# Patient Record
Sex: Male | Born: 1975
Health system: Southern US, Community
[De-identification: ages and names within clinical notes are randomized; demographics above are authoritative.]

## PROBLEM LIST (undated history)

## (undated) DIAGNOSIS — E785 Hyperlipidemia, unspecified: Secondary | ICD-10-CM

## (undated) HISTORY — DX: Hyperlipidemia, unspecified: E78.5

---

## 2000-01-22 ENCOUNTER — Emergency Department (HOSPITAL_COMMUNITY): Admission: EM | Admit: 2000-01-22 | Discharge: 2000-01-22 | Payer: Self-pay | Admitting: Emergency Medicine

## 2005-10-03 ENCOUNTER — Emergency Department (HOSPITAL_COMMUNITY): Admission: EM | Admit: 2005-10-03 | Discharge: 2005-10-03 | Payer: Self-pay | Admitting: Emergency Medicine

## 2008-01-28 ENCOUNTER — Encounter: Admission: RE | Admit: 2008-01-28 | Discharge: 2008-01-28 | Payer: Self-pay | Admitting: Family Medicine

## 2008-11-15 ENCOUNTER — Encounter: Admission: RE | Admit: 2008-11-15 | Discharge: 2008-11-15 | Payer: Self-pay | Admitting: Occupational Medicine

## 2009-07-23 ENCOUNTER — Emergency Department (HOSPITAL_COMMUNITY): Admission: EM | Admit: 2009-07-23 | Discharge: 2009-07-23 | Payer: Self-pay | Admitting: Emergency Medicine

## 2012-03-31 ENCOUNTER — Ambulatory Visit
Admission: RE | Admit: 2012-03-31 | Discharge: 2012-03-31 | Disposition: A | Discharge status: Home / Self Care | Payer: 59 | Source: Ambulatory Visit | Attending: Family Medicine | Admitting: Family Medicine

## 2012-03-31 ENCOUNTER — Other Ambulatory Visit: Payer: Self-pay | Admitting: Family Medicine

## 2012-03-31 DIAGNOSIS — M549 Dorsalgia, unspecified: Secondary | ICD-10-CM

## 2012-07-28 ENCOUNTER — Encounter (INDEPENDENT_AMBULATORY_CARE_PROVIDER_SITE_OTHER): Payer: 59

## 2012-07-28 ENCOUNTER — Other Ambulatory Visit: Payer: Self-pay | Admitting: Family Medicine

## 2012-07-28 DIAGNOSIS — E785 Hyperlipidemia, unspecified: Secondary | ICD-10-CM

## 2012-07-28 LAB — CBC WITH DIFFERENTIAL/PLATELET
Basophils Absolute: 0 10*3/uL (ref 0.0–0.1)
Basophils Relative: 0 % (ref 0–1)
Eosinophils Absolute: 0.2 10*3/uL (ref 0.0–0.7)
Eosinophils Relative: 3 % (ref 0–5)
Hemoglobin: 15.8 g/dL (ref 13.0–17.0)
MCH: 29.2 pg (ref 26.0–34.0)
Monocytes Absolute: 0.6 10*3/uL (ref 0.1–1.0)
RBC: 5.41 MIL/uL (ref 4.22–5.81)
RDW: 14.6 % (ref 11.5–15.5)

## 2012-07-28 LAB — LIPID PANEL
Cholesterol: 158 mg/dL (ref 0–200)
HDL: 43 mg/dL (ref >39)
Total CHOL/HDL Ratio: 3.7 Ratio
Triglycerides: 58 mg/dL (ref <150)
VLDL: 12 mg/dL (ref 0–40)

## 2012-07-28 LAB — BASIC METABOLIC PANEL
CO2: 26 mEq/L (ref 19–32)
Chloride: 106 mEq/L (ref 96–112)
Creat: 0.87 mg/dL (ref 0.50–1.35)

## 2012-07-28 LAB — HEPATIC FUNCTION PANEL
Albumin: 4.4 g/dL (ref 3.5–5.2)
Bilirubin, Direct: 0.2 mg/dL (ref 0.0–0.3)
Indirect Bilirubin: 0.4 mg/dL (ref 0.0–0.9)

## 2012-08-04 ENCOUNTER — Encounter: Payer: Self-pay | Admitting: Family Medicine

## 2012-08-18 ENCOUNTER — Telehealth: Payer: Self-pay | Admitting: Family Medicine

## 2012-08-18 MED ORDER — SCOPOLAMINE 1 MG/3DAYS TD PT72: 1.0000 | MEDICATED_PATCH | TRANSDERMAL | Status: DC

## 2012-08-18 NOTE — Telephone Encounter (Signed)
rx refilled.

## 2012-08-18 NOTE — Telephone Encounter (Signed)
Ok to call out scopolamine

## 2012-08-18 NOTE — Telephone Encounter (Signed)
Message copied by Ricard Dillon on Tue Aug 18, 2012  8:51 AM ------      Message from: Sherian Rein      Created: Mon Aug 17, 2012  9:58 AM      Contact: Arien Benincasa       Pt called back to receive lab results. His call back number is [phone]. ------

## 2013-03-20 ENCOUNTER — Other Ambulatory Visit: Payer: Self-pay | Admitting: Family Medicine

## 2013-05-11 ENCOUNTER — Telehealth: Payer: Self-pay | Admitting: Family Medicine

## 2013-05-11 NOTE — Telephone Encounter (Signed)
..  Patient aware per vm 

## 2013-05-11 NOTE — Telephone Encounter (Signed)
Sudafed for congestion, mucinex dm for cough, chloraseptic for st.  Expect improvement in 7-10 days

## 2013-05-11 NOTE — Telephone Encounter (Signed)
Pt is needing something for his cold he has a sore throat, and sinus drainage, has had 2 days  Call back number is [phone]

## 2013-05-25 ENCOUNTER — Encounter: Payer: Self-pay | Admitting: Family Medicine

## 2013-05-25 NOTE — Telephone Encounter (Signed)
Pt wife is calling regarding a question she had about her husband Call back number is [phone]

## 2013-05-28 NOTE — Telephone Encounter (Signed)
This encounter was created in error - please disregard.

## 2013-06-03 ENCOUNTER — Other Ambulatory Visit (HOSPITAL_COMMUNITY): Payer: Self-pay | Admitting: Internal Medicine

## 2013-06-03 ENCOUNTER — Other Ambulatory Visit: Payer: Self-pay | Admitting: Internal Medicine

## 2013-06-03 DIAGNOSIS — R52 Pain, unspecified: Secondary | ICD-10-CM

## 2013-06-03 DIAGNOSIS — N5082 Scrotal pain: Secondary | ICD-10-CM

## 2013-06-04 ENCOUNTER — Ambulatory Visit
Admission: RE | Admit: 2013-06-04 | Discharge: 2013-06-04 | Disposition: A | Discharge status: Home / Self Care | Payer: 59 | Source: Ambulatory Visit | Attending: Internal Medicine | Admitting: Internal Medicine

## 2013-06-04 DIAGNOSIS — R52 Pain, unspecified: Secondary | ICD-10-CM

## 2013-06-07 ENCOUNTER — Other Ambulatory Visit (HOSPITAL_COMMUNITY): Payer: 59

## 2014-06-20 IMAGING — US US ART/VEN ABD/PELV/SCROTUM DOPPLER LTD
1 series · 14 of 25 positions shown · non-contrast
Comparison: None.

CLINICAL DATA: Right testicular pain.  No trauma.  No swelling.

EXAM:
SCROTAL ULTRASOUND
DOPPLER ULTRASOUND OF THE TESTICLES
TECHNIQUE: Complete ultrasound examination of the testicles, epididymis, and
other scrotal structures was performed. Color and spectral Doppler
ultrasound were also utilized to evaluate blood flow to the
testicles.

[Series 1: us art/ven abd/pelv/scrotum doppler ltd · 0.09mm/px · 14 of 53 slices shown]
[im 1/53]
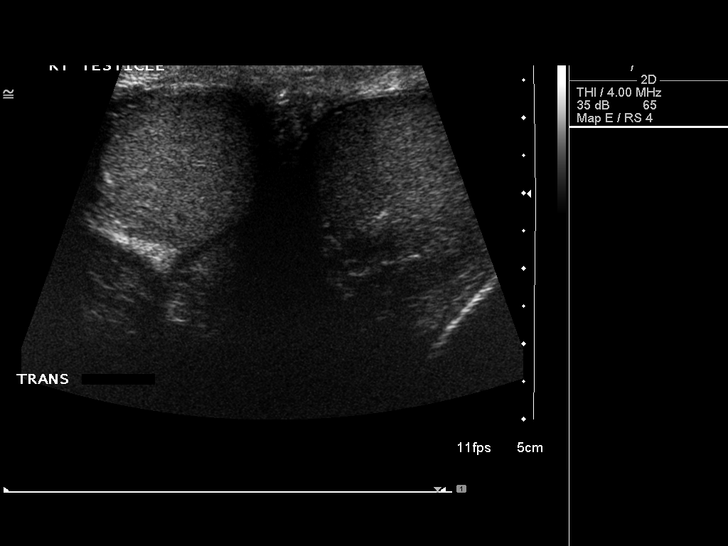
[im 5/53]
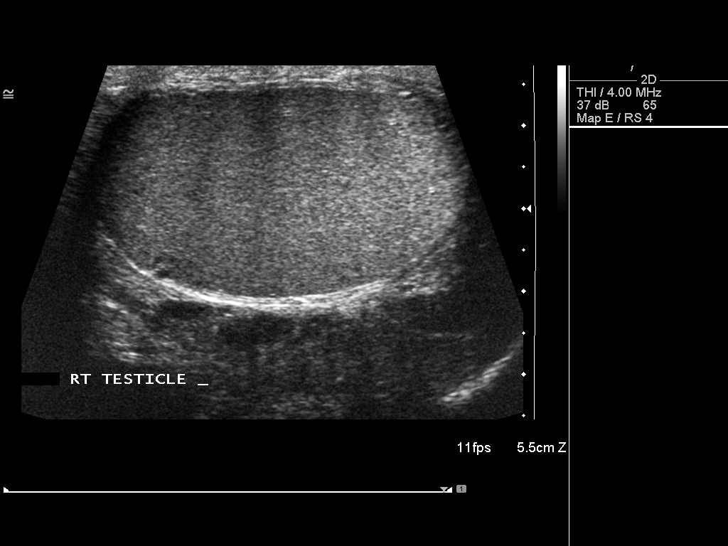
[im 9/53]
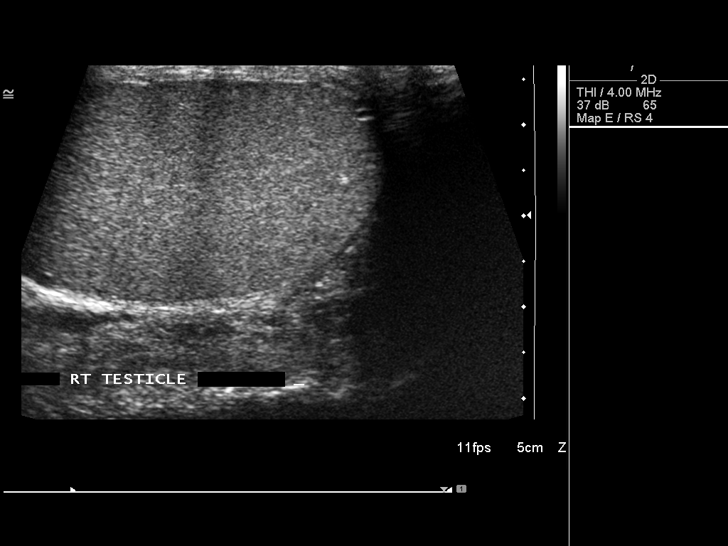
[im 14/53]
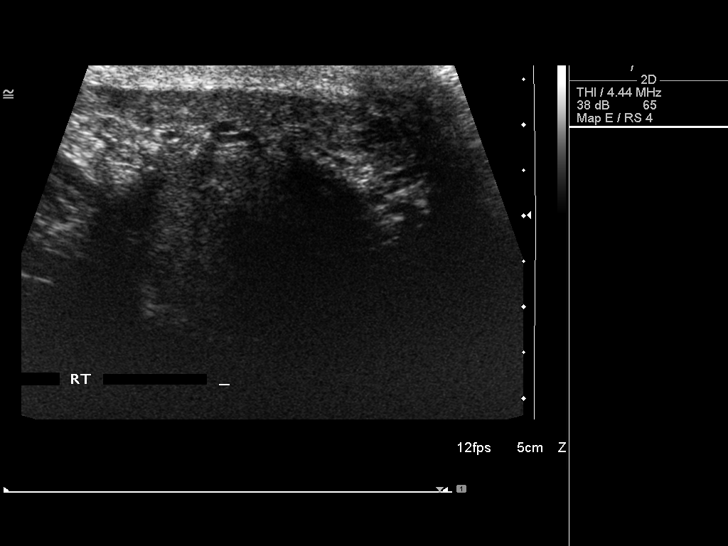
[im 18/53]
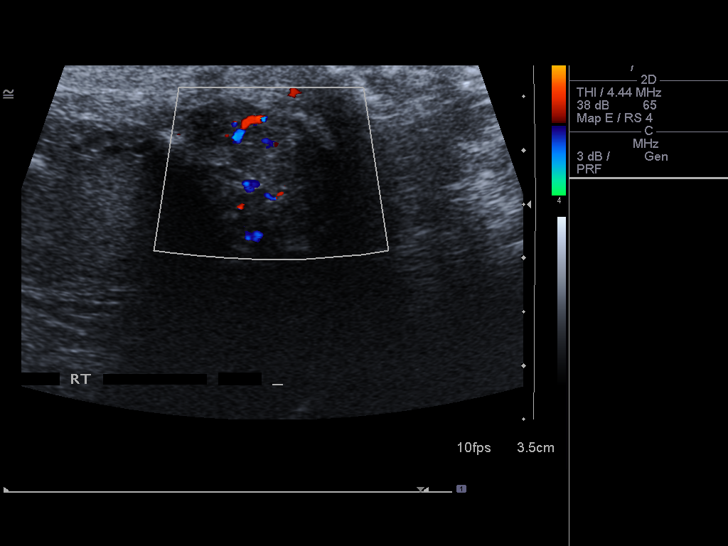
[im 20/53]
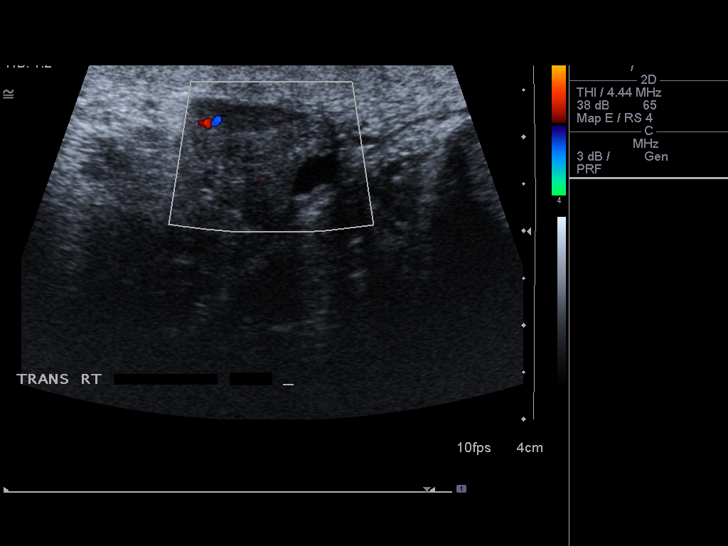
[im 24/53]
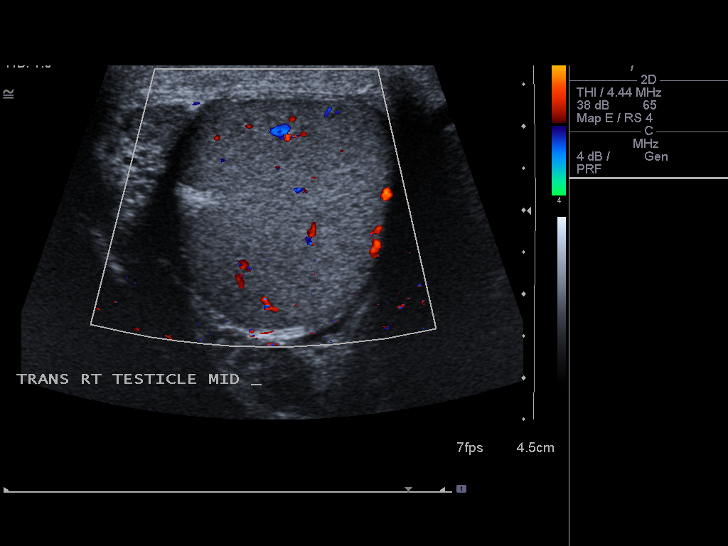
[im 29/53]
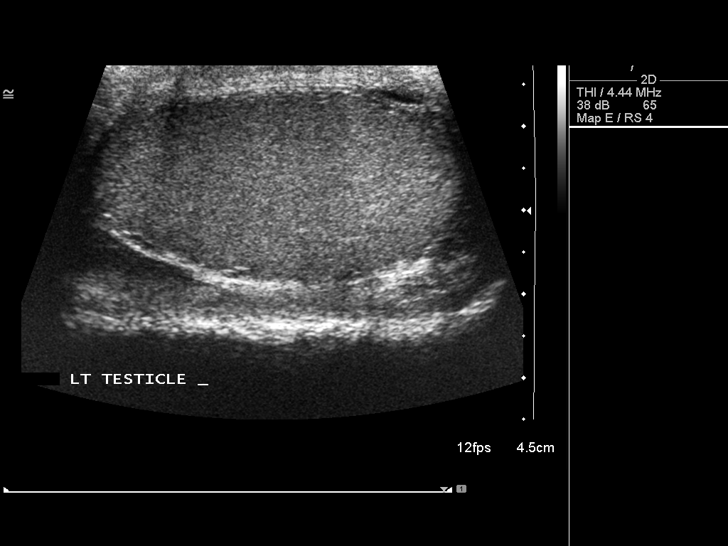
[im 33/53]
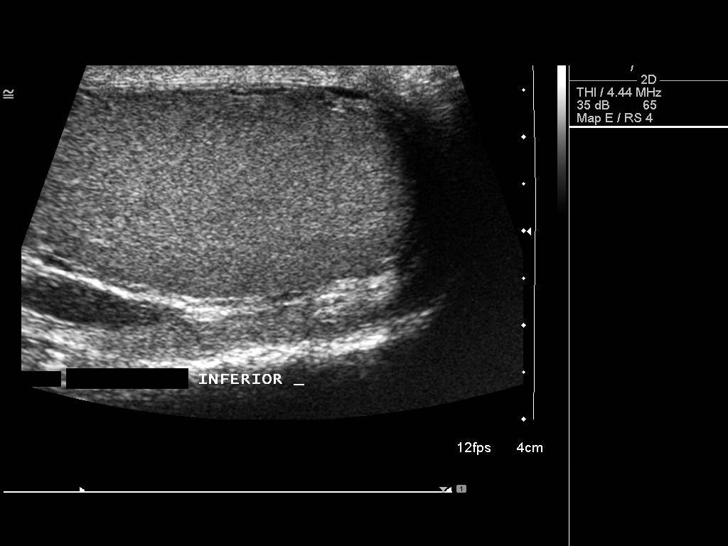
[im 35/53]
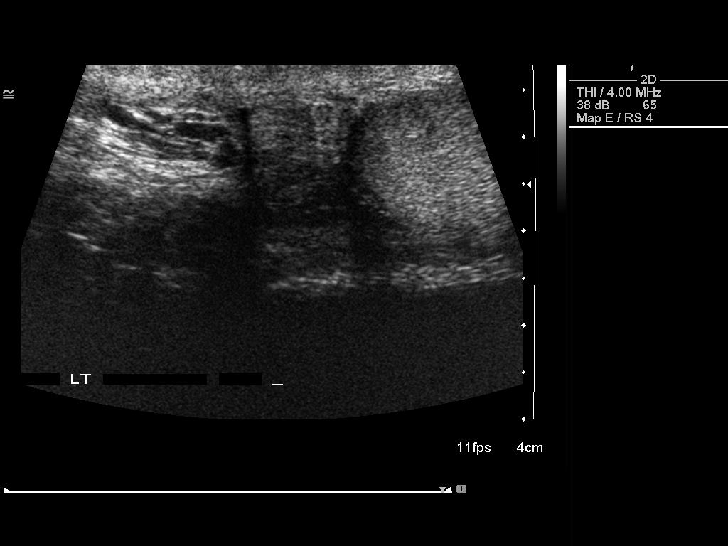
[im 40/53]
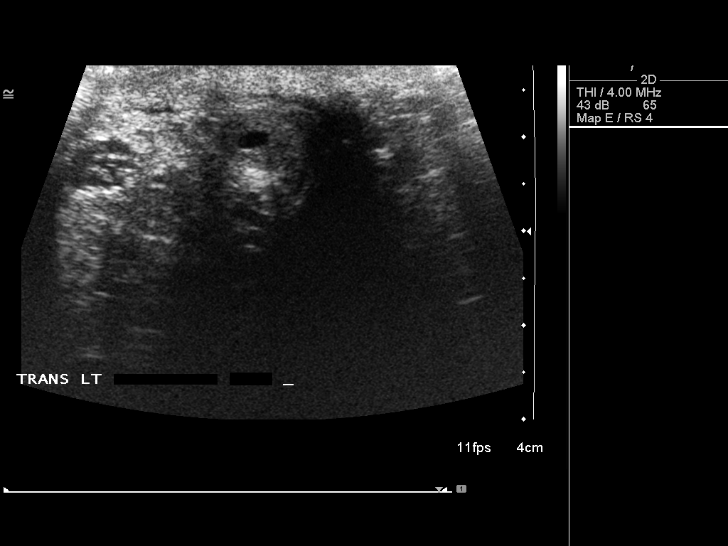
[im 44/53]
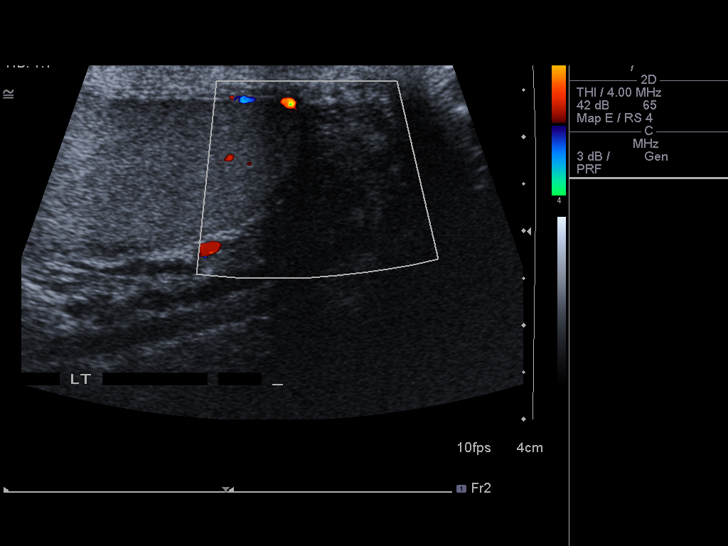
[im 48/53]
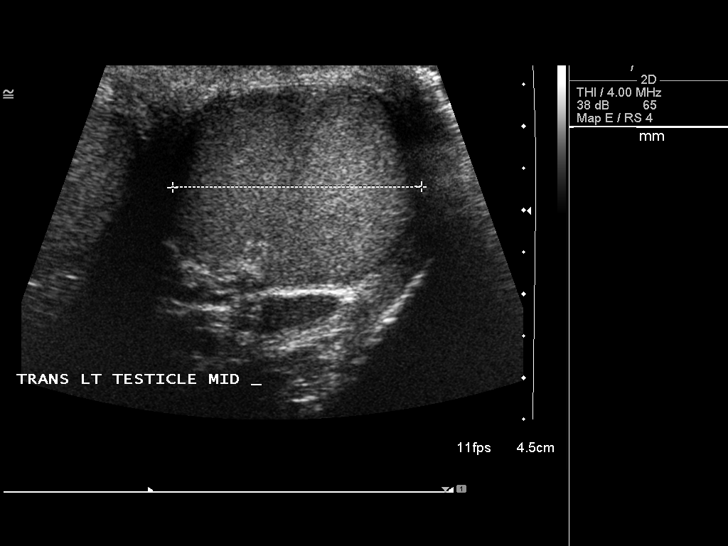
[im 53/53]
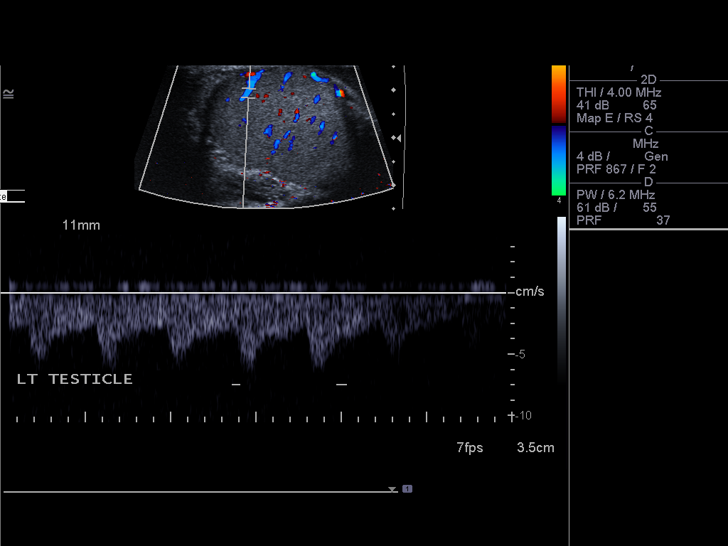

[14 of 25 positions shown; findings below may reference images not displayed]

FINDINGS: Right testicle

Measurements: 4.6 x 2.6 x 2.6 cm. No mass or microlithiasis
visualized.

Left testicle

Measurements: 4.5 x 2.4 x 3.0 cm. No mass or microlithiasis
visualized.

Right epididymis: Small 5 mm cyst in the epididymal head, otherwise
unremarkable.

Left epididymis: Small 3 mm cyst in the epididymal head, otherwise
unremarkable.

Hydrocele:  None visualized.

Varicocele:  None visualized.

Pulsed Doppler interrogation of both testes demonstrates normal low
resistance arterial and venous waveforms bilaterally.
IMPRESSION: No focal testicular abnormality. No evidence of torsion or
infection. Small bilateral epididymal head cysts.

## 2015-01-13 ENCOUNTER — Telehealth: Payer: Self-pay | Admitting: Family Medicine

## 2015-01-13 MED ORDER — SCOPOLAMINE 1 MG/3DAYS TD PT72: 1.0000 | MEDICATED_PATCH | TRANSDERMAL | Status: DC

## 2015-01-13 NOTE — Telephone Encounter (Signed)
Pt preferred the patches so Rx sent for scopolamine.

## 2015-01-13 NOTE — Telephone Encounter (Signed)
Ok with dramamine 50 mg po q 6 hrs prn OR Scopolamine patch q 72 hrs whichever he prefers.

## 2015-01-13 NOTE — Telephone Encounter (Signed)
Patient called states he is going to [place] next Friday and would like dramamine the highest dose called in to [facility] in [place]. He states he gets sea sick and is going fishing.

## 2015-01-13 NOTE — Telephone Encounter (Signed)
? -   Pt has not been seen here since Epic

## 2016-12-16 ENCOUNTER — Ambulatory Visit (INDEPENDENT_AMBULATORY_CARE_PROVIDER_SITE_OTHER): Payer: BLUE CROSS/BLUE SHIELD | Admitting: Family Medicine

## 2016-12-16 ENCOUNTER — Other Ambulatory Visit: Payer: Self-pay | Admitting: Family Medicine

## 2016-12-16 VITALS — BP 100/60 | Temp 99.5°F | Resp 14 | Ht 76.0 in | Wt 298.0 lb

## 2016-12-16 DIAGNOSIS — E860 Dehydration: Secondary | ICD-10-CM

## 2016-12-16 DIAGNOSIS — I951 Orthostatic hypotension: Secondary | ICD-10-CM | POA: Diagnosis not present

## 2016-12-16 DIAGNOSIS — I499 Cardiac arrhythmia, unspecified: Secondary | ICD-10-CM

## 2016-12-16 DIAGNOSIS — R509 Fever, unspecified: Secondary | ICD-10-CM

## 2016-12-16 LAB — CBC
HEMATOCRIT: 48.9 % (ref 38.5–50.0)
HEMOGLOBIN: 16.7 g/dL (ref 13.0–17.0)
MCH: 31.5 pg (ref 27.0–33.0)
MCHC: 34.2 g/dL (ref 32.0–36.0)
MCV: 92.3 fL (ref 80.0–100.0)
PLATELETS: 221 10*3/uL (ref 140–400)
RBC: 5.3 MIL/uL (ref 4.20–5.80)
RDW: 14.7 % (ref 11.0–15.0)
WBC: 10.7 10*3/uL (ref 3.8–10.8)

## 2016-12-16 LAB — GLUCOSE, FINGERSTICK (STAT): Glucose, fingerstick: 124 mg/dL — ABNORMAL HIGH (ref 65–99)

## 2016-12-16 LAB — COMPLETE METABOLIC PANEL WITH GFR
ALBUMIN: 4.3 g/dL (ref 3.6–5.1)
ALK PHOS: 52 U/L (ref 40–115)
ALT: 26 U/L (ref 9–46)
AST: 17 U/L (ref 10–40)
BILIRUBIN TOTAL: 1.1 mg/dL (ref 0.2–1.2)
BUN: 19 mg/dL (ref 7–25)
CO2: 22 mmol/L (ref 20–32)
CREATININE: 0.93 mg/dL (ref 0.60–1.35)
Calcium: 9.2 mg/dL (ref 8.6–10.3)
Chloride: 104 mmol/L (ref 98–110)
GFR, Est Non African American: >89 mL/min (ref >=60)
Glucose, Bld: 113 mg/dL — ABNORMAL HIGH (ref 70–99)
Potassium: 4.5 mmol/L (ref 3.5–5.3)
Sodium: 140 mmol/L (ref 135–146)
TOTAL PROTEIN: 6.5 g/dL (ref 6.1–8.1)

## 2016-12-16 MED ORDER — DOXYCYCLINE HYCLATE 100 MG PO TABS: 100.0000 mg | ORAL_TABLET | Freq: Two times a day (BID) | ORAL | 0 refills | Status: DC

## 2016-12-16 NOTE — Progress Notes (Signed)
Subjective:    Patient ID: Sean NewtonChristopher M Lafrance, male    DOB: [birth year], 41 y.o.   MRN: [id number]  HPI Saturday evening, patient admits to consuming large amounts of alcohol. Yesterday, he sat in the heat watching softball and did not drink sufficient fluid.  Today the patient was working in the heat. 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 At lunchtime, the patient suddenly became lightheaded. He felt very dizzy. He felt weak all over. He felt like his heart was racing. He began to shake uncontrollably. He became extremely anxious. His wife rushed him to the doctor's office. Upon arrival, the patient was tachycardic at 105 bpm with a relatively low blood pressure given his past history. He also had a low-grade fever to 99.5. Patient denies any symptoms of illness. He denies any ear pain, cough, sore throat, sinus pressure, sinus pain, shortness of breath, chest pain, nausea vomiting or diarrhea. He does admit to having a low-grade headache today. He also reports some mild neck stiffness in addition to diffuse body aches that have all developed today. 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 No past surgical history on file. No current outpatient prescriptions on file prior to visit.   No current facility-administered medications on file prior to visit.    No Known Allergies Social History   Social History  . Marital status: Married    Spouse name: N/A  . Number of children: N/A  . Years of education: N/A   Occupational History  . Not on file.   Social History Main Topics  . Smoking status: Not on file  . Smokeless tobacco: Not on file  . Alcohol use Not on file  . Drug use: Unknown  . Sexual activity: Not on file   Other Topics Concern  . Not on file   Social History Narrative  . No narrative on file      Review of Systems  Constitutional: Positive for chills, fatigue  and fever.  HENT: Negative for congestion, dental problem, ear discharge, ear pain, facial swelling, rhinorrhea, sinus pain, sinus pressure, sneezing and sore throat.   Eyes: Negative.   Respiratory: Negative for cough, choking, chest tightness, shortness of breath, wheezing and stridor.   Cardiovascular: Negative for chest pain, palpitations and leg swelling.  Gastrointestinal: Positive for anal bleeding. Negative for abdominal distention, abdominal pain, blood in stool, constipation, diarrhea and nausea.  Genitourinary: Positive for decreased urine volume. Negative for dysuria, flank pain, frequency, penile pain, penile swelling, testicular pain and urgency.  Musculoskeletal: Positive for arthralgias, myalgias and neck pain.  Skin: Negative for pallor, rash and wound.  Neurological: Positive for dizziness, tremors, light-headedness and headaches. Negative for seizures, syncope, facial asymmetry, speech difficulty, weakness and numbness.       Objective:   Physical Exam  Constitutional: He is oriented to person, place, and time. He appears well-developed and well-nourished. No distress.  HENT:  Head: Normocephalic and atraumatic.  Right Ear: Tympanic membrane, external ear and ear canal normal.  Left Ear: Tympanic membrane, external ear and ear canal normal. No decreased hearing is noted.  Nose: Nose normal.  Mouth/Throat: Mucous membranes are dry. No oropharyngeal exudate.  Cardiovascular: Regular rhythm.  Tachycardia present.   Pulmonary/Chest: Effort normal and breath sounds normal. No respiratory distress. He has no wheezes. He has no rales.  Abdominal: Soft. Bowel sounds are normal. He exhibits no  distension and no mass. There is no tenderness. There is no rebound and no guarding.  Musculoskeletal: He exhibits no edema or tenderness.  Neurological: He is alert and oriented to person, place, and time. He has normal reflexes. He displays normal reflexes. No cranial nerve deficit. He  exhibits normal muscle tone. Coordination normal.  Skin: No rash noted. He is not diaphoretic.  Psychiatric: His mood appears anxious.  Vitals reviewed.         Assessment & Plan:  Irregular heart beat - Plan: EKG 12-Lead, CBC with Differential/Platelet  Dehydration  Orthostatic hypotension  Low grade fever  I believe the patient was dehydrated secondary to alcohol intake and lack of adequate fluid. I then believe he developed some heat exhaustion secondary to working in the heat. I believe his blood pressure began to drop and he became lightheaded. I believe this triggered a panic attack causing his tachycardia. Other possibilities would be the low-grade fever 99.5 indicating an underlying infection causing his blood pressure to drop triggering the tachycardia. The shaking would then be due for the low-grade fever. However his exam and his history revealed no obvious sign of infection other than mild neck pain, mild headache, muscle aches, and a low-grade fever. Viral illness is unlikely this time a year. Tick fever is a possibility such as [facility] spotted fever. Patient received 2 L of IV fluid in the office. He received 1 L as a bolus and a second liter gradually over the next hour and a half. He was monitored in the office for more than an hour and a half and was completely back to his baseline by the time of discharge. CBC showed a white count of 10.7, normal hemoglobin with no evidence of leukocytosis or anemia. EKG reveals sinus tachycardia with normal intervals and a normal axis and no evidence of ischemia or infarction. I believe the majority of this was heat exhaustion and dehydration and an underlying panic attack. However I cannot exclude tick fever. I will check a CBC, CMP, Lyme titer, [place] spotted fever titers but in the meantime I'll start the patient on doxycycline 100 mg by mouth twice a day for 10 days

## 2016-12-17 LAB — ROCKY MTN SPOTTED FVR ABS PNL(IGG+IGM)
RMSF IGM: NOT DETECTED
RMSF IgG: NOT DETECTED

## 2016-12-17 LAB — LYME ABY, WSTRN BLT IGG & IGM W/BANDS
B burgdorferi IgG Abs (IB): NEGATIVE
B burgdorferi IgM Abs (IB): NEGATIVE
LYME DISEASE 23 KD IGG: NONREACTIVE
LYME DISEASE 23 KD IGM: REACTIVE — AB
LYME DISEASE 39 KD IGM: NONREACTIVE
LYME DISEASE 41 KD IGG: REACTIVE — AB
LYME DISEASE 41 KD IGM: NONREACTIVE
LYME DISEASE 45 KD IGG: NONREACTIVE
Lyme Disease 18 kD IgG: NONREACTIVE
Lyme Disease 28 kD IgG: NONREACTIVE
Lyme Disease 30 kD IgG: NONREACTIVE
Lyme Disease 39 kD IgG: NONREACTIVE
Lyme Disease 58 kD IgG: NONREACTIVE
Lyme Disease 66 kD IgG: NONREACTIVE
Lyme Disease 93 kD IgG: NONREACTIVE

## 2017-04-08 ENCOUNTER — Encounter: Payer: Self-pay | Admitting: Family Medicine

## 2017-04-08 ENCOUNTER — Ambulatory Visit: Payer: BLUE CROSS/BLUE SHIELD | Admitting: Family Medicine

## 2017-04-17 ENCOUNTER — Ambulatory Visit: Payer: BLUE CROSS/BLUE SHIELD | Admitting: Family Medicine

## 2017-04-17 ENCOUNTER — Encounter: Payer: Self-pay | Admitting: Family Medicine

## 2017-04-17 VITALS — BP 142/88 | HR 108 | Temp 98.2°F | Resp 20 | Ht 76.0 in | Wt 314.0 lb

## 2017-04-17 DIAGNOSIS — G8929 Other chronic pain: Secondary | ICD-10-CM | POA: Diagnosis not present

## 2017-04-17 DIAGNOSIS — M25561 Pain in right knee: Secondary | ICD-10-CM | POA: Diagnosis not present

## 2017-04-17 DIAGNOSIS — M549 Dorsalgia, unspecified: Secondary | ICD-10-CM | POA: Diagnosis not present

## 2017-04-17 MED ORDER — MELOXICAM 15 MG PO TABS: 15.0000 mg | ORAL_TABLET | Freq: Every day | ORAL | 0 refills | Status: DC

## 2017-04-18 NOTE — Progress Notes (Signed)
Subjective:    Patient ID: Sean Oconnor, male    DOB: September 07, 1975, 41 y.o.   MRN: [id number]  HPI  Patient presents with 2 months of right knee pain.  He denies any specific injury.  Pain primarily occurs lateral to the patella and radiate into the knee joint.  He will be completely pain-free.  Then suddenly while standing, he will developed sharp severe pain.  He has to sit down and bend his knee in a certain way and then the pain will be relieved almost as if there is a "catch" in the knee.  He also reports that the knee will buckle and feels that there is laxity in the knee joint.  On examination today, the patient has no laxity to varus or valgus stress.  He has a negative posterior drawer sign.  He has a positive anterior drawer sign and that the pain is immediately reproduced however there is no laxity in the anterior drawer sign.  He has a negative Apley grind and no evidence of a meniscal tear.  He also presents with somewhat chronic low back pain for several months.  Pain is located approximately the level of L1.  In that area there is a small sebaceous cyst overlying the point of maximum tenderness.  He is concerned that it is some type of "tumor" growing into the spine.  Cyst is approximately 1 cm diameter and freely mobile.  I will try to reassure the patient that the sebaceous cyst has nothing to do with his back pain.  He states that the pain is severe when he tries to get out of bed in the morning.  It causes sharp sudden pain but as soon as he stands up and moves around the pain will go away.  He denies any saddle anesthesia.  He denies any radiculopathy into his legs.  He denies any numbness or tingling in his legs. No past medical history on file.  No Known Allergies Social History   Socioeconomic History  . Marital status: Married    Spouse name: Not on file  . Number of children: Not on file  . Years of education: Not on file  . Highest education level: Not on file    Social Needs  . Financial resource strain: Not on file  . Food insecurity - worry: Not on file  . Food insecurity - inability: Not on file  . Transportation needs - medical: Not on file  . Transportation needs - non-medical: Not on file  Occupational History  . Not on file  Tobacco Use  . Smoking status: Former Games developermoker  . Smokeless tobacco: Never Used  Substance and Sexual Activity  . Alcohol use: Yes  . Drug use: No  . Sexual activity: Not on file  Other Topics Concern  . Not on file  Social History Narrative  . Not on file     Review of Systems  All other systems reviewed and are negative.      Objective:   Physical Exam  Constitutional: He appears well-developed and well-nourished.  Cardiovascular: Normal rate, regular rhythm and normal heart sounds.  Pulmonary/Chest: Effort normal and breath sounds normal. No respiratory distress. He has no wheezes. He has no rales.  Musculoskeletal:       Right knee: He exhibits normal range of motion, no swelling, no effusion, no ecchymosis, no deformity, no erythema, no LCL laxity, normal patellar mobility, normal meniscus and no MCL laxity. No medial joint line, no lateral joint  line, no MCL and no LCL tenderness noted.       Lumbar back: He exhibits decreased range of motion and pain. He exhibits no tenderness and no bony tenderness.       Back:  Vitals reviewed.         Assessment & Plan:  Mid back pain - Plan: DG Lumbar Spine Complete  Chronic pain of right knee - Plan: DG Knee Complete 4 Views Right  I suspect that the cause of his back pain is muscle pain coupled with possible early degenerative disc disease given his obesity.  We will begin by obtaining a lumbar spine x-ray to evaluate further.  I have recommended meloxicam 15 mg daily.  I am suspicious that the patient may have a sprain in his ACL or possibly a cartilage defect in the knee that would benefit from scopic surgery.  I will begin by obtaining an x-ray to  evaluate further.  Begin meloxicam 15 mg daily.  Patient states the pain is gradually improving.  Therefore I have recommended clinical monitoring for 1 month.  If pain persists, consider a cortisone injection versus an MRI of the knee to evaluate further to determine if the patient would benefit from arthroscopic surgery.

## 2018-10-22 ENCOUNTER — Encounter: Payer: BLUE CROSS/BLUE SHIELD | Admitting: Family Medicine

## 2018-10-22 ENCOUNTER — Other Ambulatory Visit: Payer: BC Managed Care – PPO

## 2018-10-22 ENCOUNTER — Other Ambulatory Visit: Payer: Self-pay

## 2018-10-22 DIAGNOSIS — Z0000 Encounter for general adult medical examination without abnormal findings: Secondary | ICD-10-CM

## 2018-10-22 DIAGNOSIS — Z13220 Encounter for screening for lipoid disorders: Secondary | ICD-10-CM | POA: Insufficient documentation

## 2018-10-23 ENCOUNTER — Encounter: Payer: Self-pay | Admitting: Family Medicine

## 2018-10-23 ENCOUNTER — Ambulatory Visit (INDEPENDENT_AMBULATORY_CARE_PROVIDER_SITE_OTHER): Payer: BC Managed Care – PPO | Admitting: Family Medicine

## 2018-10-23 VITALS — BP 142/88 | HR 100 | Temp 98.0°F | Resp 17 | Ht 76.0 in | Wt 330.4 lb

## 2018-10-23 DIAGNOSIS — Z0000 Encounter for general adult medical examination without abnormal findings: Secondary | ICD-10-CM

## 2018-10-23 DIAGNOSIS — Z6841 Body Mass Index (BMI) 40.0 and over, adult: Secondary | ICD-10-CM

## 2018-10-23 DIAGNOSIS — R030 Elevated blood-pressure reading, without diagnosis of hypertension: Secondary | ICD-10-CM

## 2018-10-23 DIAGNOSIS — E785 Hyperlipidemia, unspecified: Secondary | ICD-10-CM

## 2018-10-23 DIAGNOSIS — Z0001 Encounter for general adult medical examination with abnormal findings: Secondary | ICD-10-CM | POA: Diagnosis not present

## 2018-10-23 DIAGNOSIS — Z8249 Family history of ischemic heart disease and other diseases of the circulatory system: Secondary | ICD-10-CM | POA: Diagnosis not present

## 2018-10-23 DIAGNOSIS — Z83438 Family history of other disorder of lipoprotein metabolism and other lipidemia: Secondary | ICD-10-CM

## 2018-10-23 DIAGNOSIS — Z23 Encounter for immunization: Secondary | ICD-10-CM | POA: Diagnosis not present

## 2018-10-23 LAB — CBC WITH DIFFERENTIAL/PLATELET
Absolute Monocytes: 600 cells/uL (ref 200–950)
Basophils Absolute: 28 cells/uL (ref 0–200)
Basophils Relative: 0.4 %
Eosinophils Absolute: 262 cells/uL (ref 15–500)
Eosinophils Relative: 3.8 %
HCT: 50.4 % — ABNORMAL HIGH (ref 38.5–50.0)
Hemoglobin: 16.5 g/dL (ref 13.2–17.1)
Lymphs Abs: 1994 cells/uL (ref 850–3900)
MCH: 29 pg (ref 27.0–33.0)
MCHC: 32.7 g/dL (ref 32.0–36.0)
MCV: 88.6 fL (ref 80.0–100.0)
MPV: 9.5 fL (ref 7.5–12.5)
Monocytes Relative: 8.7 %
Neutro Abs: 4016 cells/uL (ref 1500–7800)
Neutrophils Relative %: 58.2 %
Platelets: 286 10*3/uL (ref 140–400)
RBC: 5.69 10*6/uL (ref 4.20–5.80)
RDW: 13.5 % (ref 11.0–15.0)
Total Lymphocyte: 28.9 %
WBC: 6.9 10*3/uL (ref 3.8–10.8)

## 2018-10-23 LAB — COMPLETE METABOLIC PANEL WITH GFR
AG Ratio: 1.9 (calc) (ref 1.0–2.5)
ALT: 29 U/L (ref 9–46)
AST: 21 U/L (ref 10–40)
Albumin: 4.3 g/dL (ref 3.6–5.1)
Alkaline phosphatase (APISO): 64 U/L (ref 36–130)
BUN: 17 mg/dL (ref 7–25)
CO2: 25 mmol/L (ref 20–32)
Calcium: 9.5 mg/dL (ref 8.6–10.3)
Chloride: 104 mmol/L (ref 98–110)
Creat: 0.94 mg/dL (ref 0.60–1.35)
GFR, Est African American: 117 mL/min/{1.73_m2} (ref >=60)
GFR, Est Non African American: 101 mL/min/{1.73_m2} (ref >=60)
Globulin: 2.3 g/dL (calc) (ref 1.9–3.7)
Glucose, Bld: 99 mg/dL (ref 65–99)
Potassium: 4.9 mmol/L (ref 3.5–5.3)
Sodium: 138 mmol/L (ref 135–146)
Total Bilirubin: 1.1 mg/dL (ref 0.2–1.2)
Total Protein: 6.6 g/dL (ref 6.1–8.1)

## 2018-10-23 LAB — LIPID PANEL
Cholesterol: 261 mg/dL — ABNORMAL HIGH (ref <200)
HDL: 50 mg/dL (ref >=40)
LDL Cholesterol (Calc): 193 mg/dL (calc) — ABNORMAL HIGH
Non-HDL Cholesterol (Calc): 211 mg/dL (calc) — ABNORMAL HIGH (ref <130)
Total CHOL/HDL Ratio: 5.2 (calc) — ABNORMAL HIGH (ref <5.0)
Triglycerides: 78 mg/dL (ref <150)

## 2018-10-23 MED ORDER — ATORVASTATIN CALCIUM 40 MG PO TABS: 40.0000 mg | ORAL_TABLET | Freq: Every day | ORAL | 2 refills | Status: AC

## 2018-10-23 MED ORDER — LISINOPRIL 20 MG PO TABS: 20.0000 mg | ORAL_TABLET | Freq: Every day | ORAL | 3 refills | Status: AC

## 2018-10-23 NOTE — Assessment & Plan Note (Addendum)
With his weight, HLD, BP reading today and previously, as well as family hx, likely needs BP tx with ACEI, but again he is hesitant to take medications (or at least forgetful) Tx with diet and lifestyle changes, monitory BP, if >130/80 start lisinopril (rx sent to pharmacy) 3 month f/up

## 2018-10-23 NOTE — Assessment & Plan Note (Signed)
Hx of HLD, and strong family hx as well Non-compliant with meds, no dietary or lifestyle efforts, additionally family hx of MI, his father and 2 brothers @ 59 and 41 y/o - one who had fatal MI Advised diet changes, restart statin HLD handout given from [facility] F/up in 3 months with repeat labs Advised him to get wife and/or daughters on board to help remind him to take meds

## 2018-10-23 NOTE — Patient Instructions (Addendum)
[email or web address].heart.org/en/health-topics/heart-attack/understand-your-risks-to-prevent-a-heart-attack   Understand Your Risks to Prevent a Heart Attack  Knowledge is power. Understand the risks you face for heart attack.  Extensive research has identified factors that increase a person's risk for coronary heart disease in general and heart attack in particular.  The more risk factors you have, and the greater the degree of each risk factor, the higher your chance of developing coronary heart disease - a common term for the buildup of plaque in the heart's arteries that could lead to heart attack. Risk factors fall into three broad categories: 1. Major risk factors - Research has shown that these factors significantly increase the risk of heart and blood vessel (cardiovascular) disease. 2. Modifiable risk factors - Some major risk factors can be modified, treated or controlled through medications or lifestyle change. 3. Contributing risk factors - These factors are associated with increased risk of cardiovascular disease, but their significance and prevalence haven't yet been determined. 4.  The American Heart Association recommends focusing on heart disease prevention early in life. To start, assess your risk factors and work to keep them low. The sooner you identify and manage your risk factors, the better your chances of leading a heart-healthy life.  The three categories of risk factors are detailed here: Major risk factors that can't be changed  You may be born with certain risk factors that cannot be changed. The more of these risk factors you have, the greater your chance of developing coronary heart disease. Since you can't do anything about these risk factors, it's even more important that you manage your risk factors that can be changed.  Increasing Age The majority of people who die of coronary heart disease are 4265 or older. While heart attacks can strike people of both sexes in old  age, women are at greater risk of dying (within a few weeks).  Male gender Men have a greater risk of heart attack than women do, and men have attacks earlier in life. Even after women reach the age of menopause, when women's death rate from heart disease increases, women's risk for heart attack is less than that for men.  Heredity (including race) Children of parents with heart disease are more likely to develop heart disease themselves. African-Americans have more severe high blood pressure than Caucasians, and a higher risk of heart disease. Heart disease risk is also higher among Mexican-Americans, American Indians, native Hawaiians and some Asian-Americans. This is partly due to higher rates of obesity and diabetes. Most people with a significant family history of heart disease have one or more other risk factors. Just as you can't control your age, sex and race, you can't control your family history. So, it's even more important to treat and control any other modifiable risk factors you have.  Major risk factors you can modify, treat or control  Tobacco smoke The risk that smokers will develop coronary heart disease is much higher than that for nonsmokers. Cigarette smoking is a powerful independent risk factor for sudden cardiac death in patients with coronary heart disease. Cigarette smoking also interacts with other risk factors to greatly increase the risk for coronary heart disease. Exposure to other people's smoke increases the risk of heart disease even for nonsmokers. Learn about smoking and cardiovascular disease  High blood cholesterol As your blood cholesterol rises, so does your risk of coronary heart disease. When other risk factors (such as high blood pressure and tobacco smoke) are also present, this risk increases even more. A person's cholesterol level  is also affected by age, sex, heredity and diet. Here's the lowdown on:  Total cholesterol  Your total cholesterol score  is calculated using the following equation: HDL + LDL + 20 percent of your triglyceride level.  Low-density-lipoprotein (LDL) cholesterol = "bad" cholesterol  A low LDL cholesterol level is considered good for your heart health. However, your LDL number should not be the main factor in guiding treatment to prevent heart attack and stroke, according to the latest guidelines from the American Heart Association. In addition, patients taking statins no longer need to get LDL cholesterol levels down to a specific target number. Lifestyle factors, such as a diet high in saturated and trans fats, can raise LDL cholesterol.  High-density-lipoprotein (HDL) cholesterol = "good" cholesterol  With HDL (good) cholesterol, higher levels are typically better. Low HDL cholesterol puts you at higher risk for heart disease. People with high blood triglycerides usually also have lower HDL cholesterol. Genetic factors, Type 2 diabetes, smoking, being overweight and being sedentary can all result in lower HDL cholesterol.  Triglycerides  Triglycerides are the most common type of fat in the body. Normal triglyceride levels vary by age and sex. A high triglyceride level combined with low HDL cholesterol or high LDL cholesterol is associated with atherosclerosis, which is the buildup of fatty deposits inside artery walls that increases the risk for heart attack and stroke.  Learn more about managing your cholesterol.  High blood pressure High blood pressure increases the heart's workload, causing the heart muscle to thicken and become stiffer. This stiffening of the heart muscle is not normal and causes the heart to function abnormally. It also increases your risk of stroke, heart attack, kidney failure and congestive heart failure. When high blood pressure is present alongside obesity, smoking, high blood cholesterol levels or diabetes, the risk of heart attack or stroke increases even more. Learn more about managing your  blood pressure.  Physical inactivity An inactive lifestyle is a risk factor for coronary heart disease. Regular, moderate to vigorous physical activity helps reduce the risk of cardiovascular disease. Physical activity can help control blood cholesterol, diabetes and obesity. It can also help to lower blood pressure in some people. Learn more about getting active.  Obesity and being overweight People who have excess body fat - especially if a lot of it is at the waist - are more likely to develop heart disease and stroke, even if those same people have no other risk factors. Overweight and obese adults with risk factors for cardiovascular disease such as high blood pressure, high cholesterol or high blood sugar can make lifestyle changes to lose weight and produce significant reductions in risk factors such as triglycerides, blood glucose, HbA1c and the risk of developing Type 2 diabetes. Many people may have difficulty losing weight. But for those above a healthy weight, a sustained weight loss of 3 to 5 percent of your body weight may lead to significant reductions in some risk factors. Greater sustained weight losses can improve blood pressure, cholesterol and blood glucose. Learn more about managing your weight.  Diabetes Diabetes seriously increases your risk of developing cardiovascular disease. Even when glucose levels are under control, diabetes increases the risk of heart disease and stroke. The risks are even greater if blood sugar is not well-controlled. At least 37 percent of people with diabetes over 86 years of age die of some form of heart disease. Among that same group, 16 percent die of stroke. If you have diabetes, be sure to  work with your doctor to manage it, and control any other risk factors that you can. To help manage blood sugar, people with diabetes who are obese or overweight should make lifestyle changes, such as eating better or getting regular physical activity. Learn  more about managing your diabetes.  Other factors that contribute to heart disease risk Stress Individual response to stress may be a contributing factor for heart attacks. Some scientists have noted a relationship between coronary heart disease risk and stress in a person's life, along with their health behaviors and socioeconomic status. These factors may affect established risk factors. For example, people under stress may overeat, start smoking or smoke more than they otherwise would. Get stress management tips and tools.  Alcohol Drinking too much alcohol can raise blood pressure, and increase your risk for cardiomyopathy, stroke, cancer and other diseases. It can also contribute to high triglycerides, and produce irregular heartbeats. Additionally, excessive alcohol consumption contributes to obesity, alcoholism, suicide and accidents. All that said, there is a protective benefit to moderate alcohol consumption. If you drink, limit your alcohol consumption to no more than two drinks per day for men and no more than one drink per day for women. The [employer] on Alcohol Abuse and Alcoholism defines one drink as 1 1/2 fluid ounces (fl. oz.) of 80-proof spirits (such as bourbon, scotch, vodka, gin, etc.), 5 fl. oz. of wine or 12 fl. oz. of regular beer. It is not recommended that nondrinkers start using alcohol or that drinkers increase the amount they drink.  Read our recommendation on alcohol and cardiovascular disease.  Diet and nutrition A healthy diet is one of the best weapons you have to fight cardiovascular disease. What you eat (and how much) can affect other controllable risk factors, such as cholesterol, blood pressure, diabetes and being overweight. Choose nutrient-rich foods, which have vitamins, minerals, fiber and other nutrients, but are lower in calories than nutrient-poor foods. Choose a diet that emphasizes vegetables, fruits and whole grains. A heart-healthy diet also  includes low-fat dairy products, poultry, fish, legumes, nuts and nontropical vegetable oils. Be sure to limit your intake of sweets, sugar-sweetened beverages and red meats. To maintain a healthy weight, coordinate your diet with your physical activity level so you're using up as many calories as you take in. Learn more about healthy eating.   Preventing heart attacks  Too young to worry about heart attack? A heart attack can occur at any age. You're never too young to start heart-healthy living. If you're over 40, or if you have multiple risk factors, work closely with your doctor to address your risk of developing cardiovascular disease. Heart attack prevention is critical. It should begin early in life. Start with an assessment of your risk factors. Then develop a plan you can follow to maintain a low risk for heart attack. For many people, their first heart attack is disabling or even fatal. Do everything you can to lower your risk.  Learn heart-health basics Reducing your risk starts with smart choices. If you smoke, stop. The American Heart Association has tools to help you quit.  Work with your doctor to manage your risk factors. These might include high blood pressure, high cholesterol and diabetes.  An active lifestyle and good nutrition have also been shown to be helpful in preventing heart attack. See more lifestyle tips for heart attack prevention.  Follow seven simple steps toward healthier living.  View an animation of a heart attack. Learn how a heart attack  affects your heart health.

## 2018-10-23 NOTE — Progress Notes (Signed)
Patient: Sean NewtonChristopher M Oconnor, Male    DOB: [birth year], 43 y.o.   MRN: [id number] Visit Date: 10/27/2018  Today's Provider: Danelle BerryLeisa Arath Kaigler, PA-C   Chief Complaint  Patient presents with  . Annual Exam    Patient states no other concerns this visit!   Subjective:    Annual physical exam Sean NewtonChristopher M Oconnor is a 43 y.o. male who presents today for health maintenance and complete physical. He feels well. He reports not exercising, but has a fairly physical job. He reports he is sleeping well.  Does snore loudly he says, but no known apnea  -----------------------------------------------------------------  Pt previously did lab work, they were printed and reviewed here with him today - see below  HLD - pt state he is a horrible medicine taker, was previously on some "tor" medicine, but can't remember to take it and its currently out.  He is not doing any dietary or lifestyle changes.    BP mildly elevated today in clinic to 142/88, has been similar in the past, not on meds for BP control  Pt notes strong family hx of HLD, also two brothers with MI, one at 41 y/o and one at 41 y/o, one died from it but also had uncontrolled DM as well.  Father had MI too, but pt does not know his age.  Pt works for himself.  He's married with three daughter.  Review of Systems  Constitutional: Negative.  Negative for activity change, appetite change, fatigue and unexpected weight change.  HENT: Negative.   Eyes: Negative.   Respiratory: Negative.  Negative for chest tightness, shortness of breath, wheezing and stridor.   Cardiovascular: Negative.  Negative for chest pain, palpitations and leg swelling.  Gastrointestinal: Negative.  Negative for abdominal pain and blood in stool.  Endocrine: Negative.   Genitourinary: Negative.  Negative for decreased urine volume, difficulty urinating, testicular pain and urgency.  Musculoskeletal: Negative.   Skin: Negative.  Negative for color change and pallor.   Allergic/Immunologic: Negative.   Neurological: Negative.  Negative for syncope, weakness, light-headedness and numbness.  Hematological: Negative.   Psychiatric/Behavioral: Negative.  Negative for confusion, dysphoric mood, self-injury, sleep disturbance and suicidal ideas. The patient is not nervous/anxious.   All other systems reviewed and are negative.   Social History      He  reports that he has quit smoking. He has never used smokeless tobacco. He reports current alcohol use. He reports that he does not use drugs.       Social History   Socioeconomic History  . Marital status: Married    Spouse name: Not on file  . Number of children: 3  . Years of education: Not on file  . Highest education level: Not on file  Occupational History  . Not on file  Social Needs  . Financial resource strain: Not on file  . Food insecurity    Worry: Not on file    Inability: Not on file  . Transportation needs    Medical: Not on file    Non-medical: Not on file  Tobacco Use  . Smoking status: Former Games developermoker  . Smokeless tobacco: Never Used  Substance and Sexual Activity  . Alcohol use: Yes  . Drug use: No  . Sexual activity: Yes  Lifestyle  . Physical activity    Days per week: 0 days    Minutes per session: Not on file  . Stress: Not on file  Relationships  . Social connections    Talks  on phone: Not on file    Gets together: Not on file    Attends religious service: Not on file    Active member of club or organization: Not on file    Attends meetings of clubs or organizations: Not on file    Relationship status: Not on file  Other Topics Concern  . Not on file  Social History Narrative  . Not on file    Past Medical History:  Diagnosis Date  . Hyperlipidemia      Patient Active Problem List   Diagnosis Date Noted  . Hyperlipidemia 10/23/2018  . Family history of MI (myocardial infarction) 10/23/2018  . Family history of hyperlipidemia 10/23/2018  . Elevated blood  pressure reading 10/23/2018  . Class 3 severe obesity with serious comorbidity and body mass index (BMI) of 40.0 to 44.9 in adult (HCC) 10/23/2018  . Routine general medical examination at a health care facility 10/22/2018  . Screening cholesterol level 10/22/2018    History reviewed. No pertinent surgical history.  Family History        Family Status  Relation Name Status  . Father  (Not Specified)  . Brother  Deceased  . Brother  (Not Specified)        His family history includes Heart disease in his brother, brother, and father; Hyperlipidemia in his brother, brother, and father.      No Known Allergies   Current Outpatient Medications:  .  atorvastatin (LIPITOR) 40 MG tablet, Take 1 tablet (40 mg total) by mouth at bedtime., Disp: 90 tablet, Rfl: 2 .  lisinopril (ZESTRIL) 20 MG tablet, Take 1 tablet (20 mg total) by mouth daily., Disp: 90 tablet, Rfl: 3   Patient Care Team: Donita BrooksPickard, Warren T, MD as PCP - General (Family Medicine)      Objective:   Vitals: BP (!) 142/88   Pulse 100   Temp 98 F (36.7 C) (Oral)   Resp 17   Ht 6\' 4"  (1.93 m)   Wt (!) 330 lb 6 oz (149.9 kg)   SpO2 95%   BMI 40.21 kg/m    Vitals:   10/23/18 1555  BP: (!) 142/88  Pulse: 100  Resp: 17  Temp: 98 F (36.7 C)  TempSrc: Oral  SpO2: 95%  Weight: (!) 330 lb 6 oz (149.9 kg)  Height: 6\' 4"  (1.93 m)     Physical Exam Vitals signs and nursing note reviewed.  Constitutional:      General: He is not in acute distress.    Appearance: Normal appearance. He is well-developed. He is obese. He is not ill-appearing, toxic-appearing or diaphoretic.  HENT:     Head: Normocephalic and atraumatic.     Jaw: No trismus.     Right Ear: Tympanic membrane, ear canal and external ear normal.     Left Ear: Tympanic membrane, ear canal and external ear normal.     Nose: Nose normal. No mucosal edema or rhinorrhea.     Right Sinus: No maxillary sinus tenderness or frontal sinus tenderness.     Left  Sinus: No maxillary sinus tenderness or frontal sinus tenderness.     Mouth/Throat:     Mouth: Mucous membranes are moist.     Pharynx: Oropharynx is clear. Uvula midline. No oropharyngeal exudate, posterior oropharyngeal erythema or uvula swelling.  Eyes:     General: Lids are normal.     Conjunctiva/sclera: Conjunctivae normal.     Pupils: Pupils are equal, round, and reactive to light.  Neck:     Musculoskeletal: Normal range of motion and neck supple.     Trachea: Trachea and phonation normal. No tracheal deviation.  Cardiovascular:     Rate and Rhythm: Normal rate and regular rhythm.     Pulses: Normal pulses.          Radial pulses are 2+ on the right side and 2+ on the left side.       Posterior tibial pulses are 2+ on the right side and 2+ on the left side.     Heart sounds: Normal heart sounds. No murmur. No friction rub. No gallop.      Comments: HR normal at time of exam Pulmonary:     Effort: Pulmonary effort is normal. No respiratory distress.     Breath sounds: Normal breath sounds. No wheezing, rhonchi or rales.  Abdominal:     General: Bowel sounds are normal. There is no distension.     Palpations: Abdomen is soft.     Tenderness: There is no abdominal tenderness. There is no guarding or rebound.  Musculoskeletal: Normal range of motion.     Right lower leg: No edema.     Left lower leg: No edema.  Skin:    General: Skin is warm and dry.     Capillary Refill: Capillary refill takes less than 2 seconds.     Coloration: Skin is not jaundiced or pale.     Findings: No rash.  Neurological:     Mental Status: He is alert and oriented to person, place, and time.     Gait: Gait normal.  Psychiatric:        Mood and Affect: Mood normal.        Speech: Speech normal.        Behavior: Behavior normal.        Thought Content: Thought content normal.        Judgment: Judgment normal.      Depression Screen PHQ 2/9 Scores 10/23/2018  PHQ - 2 Score 0  PHQ- 9 Score 0    AUDIT (not done) - hx of some binge drinking noted in prior OV, and + ETOH without specification of what kind, how often and how much   Results for orders placed or performed in visit on 10/22/18  CBC with Differential/Platelet  Result Value Ref Range   WBC 6.9 3.8 - 10.8 Thousand/uL   RBC 5.69 4.20 - 5.80 Million/uL   Hemoglobin 16.5 13.2 - 17.1 g/dL   HCT 11.9 (H) 14.7 - 82.9 %   MCV 88.6 80.0 - 100.0 fL   MCH 29.0 27.0 - 33.0 pg   MCHC 32.7 32.0 - 36.0 g/dL   RDW 56.2 13.0 - 86.5 %   Platelets 286 140 - 400 Thousand/uL   MPV 9.5 7.5 - 12.5 fL   Neutro Abs 4,016 1,500 - 7,800 cells/uL   Lymphs Abs 1,994 850 - 3,900 cells/uL   Absolute Monocytes 600 200 - 950 cells/uL   Eosinophils Absolute 262 15 - 500 cells/uL   Basophils Absolute 28 0 - 200 cells/uL   Neutrophils Relative % 58.2 %   Total Lymphocyte 28.9 %   Monocytes Relative 8.7 %   Eosinophils Relative 3.8 %   Basophils Relative 0.4 %  COMPLETE METABOLIC PANEL WITH GFR  Result Value Ref Range   Glucose, Bld 99 65 - 99 mg/dL   BUN 17 7 - 25 mg/dL   Creat 7.84 6.96 - 2.95 mg/dL   GFR, Est  Non African American 101 > OR = 60 mL/min/1.5073m2   GFR, Est African American 117 > OR = 60 mL/min/1.2573m2   BUN/Creatinine Ratio NOT APPLICABLE 6 - 22 (calc)   Sodium 138 135 - 146 mmol/L   Potassium 4.9 3.5 - 5.3 mmol/L   Chloride 104 98 - 110 mmol/L   CO2 25 20 - 32 mmol/L   Calcium 9.5 8.6 - 10.3 mg/dL   Total Protein 6.6 6.1 - 8.1 g/dL   Albumin 4.3 3.6 - 5.1 g/dL   Globulin 2.3 1.9 - 3.7 g/dL (calc)   AG Ratio 1.9 1.0 - 2.5 (calc)   Total Bilirubin 1.1 0.2 - 1.2 mg/dL   Alkaline phosphatase (APISO) 64 36 - 130 U/L   AST 21 10 - 40 U/L   ALT 29 9 - 46 U/L  Lipid panel  Result Value Ref Range   Cholesterol 261 (H) <200 mg/dL   HDL 50 > OR = 40 mg/dL   Triglycerides 78 <960<150 mg/dL   LDL Cholesterol (Calc) 193 (H) mg/dL (calc)   Total CHOL/HDL Ratio 5.2 (H) <5.0 (calc)   Non-HDL Cholesterol (Calc) 211 (H) <130 mg/dL  (calc)       Assessment & Plan:     Routine Health Maintenance and Physical Exam  Exercise Activities and Dietary recommendations Goals   Diet to lower cholesterol    Discussed health benefits of physical activity, and encouraged him to engage in regular exercise appropriate for his age and condition.    There is no immunization history on file for this patient.  Health Maintenance  Topic Date Due  . HIV Screening  05/05/1993  . TETANUS/TDAP  05/05/1997  . INFLUENZA VACCINE  12/12/2018    Problem List Items Addressed This Visit      Other   Routine general medical examination at a health care facility - Primary   Hyperlipidemia    Hx of HLD, and strong family hx as well Non-compliant with meds, no dietary or lifestyle efforts, additionally family hx of MI, his father and 2 brothers @ 6150 and 41 y/o - one who had fatal MI Advised diet changes, restart statin [facility] 3 months with repeat labs Advised him to get wife and/or daughters on board to help remind him to take meds      Relevant Medications   atorvastatin (LIPITOR) 40 MG tablet   lisinopril (ZESTRIL) 20 MG tablet   Other Relevant Orders   COMPLETE METABOLIC PANEL WITH GFR   Lipid Panel   Family history of MI (myocardial infarction)   Relevant Medications   atorvastatin (LIPITOR) 40 MG tablet   Family history of hyperlipidemia   Relevant Medications   atorvastatin (LIPITOR) 40 MG tablet   Elevated blood pressure reading    With his weight, HLD, BP reading today and previously, as well as family hx, likely needs BP tx with ACEI, but again he is hesitant to take medications (or at least forgetful) Tx with diet and lifestyle changes, monitory BP, if >130/80 start lisinopril (rx sent to pharmacy) 3 month f/up      Relevant Medications   lisinopril (ZESTRIL) 20 MG tablet   Class 3 severe obesity with serious comorbidity and body mass index (BMI) of 40.0 to 44.9 in adult [facility](HCC)     Concern for metabolic syndrome and comorbidities - HTN, HLD, sleep apnea?  With strong cardiac family hx.  Needs increased physical activity and calorie reduction  Other Visit Diagnoses    Need for prophylactic vaccination against diphtheria and tetanus        Tdap not done today.  Will give f/up reminder/option to pt to come in for nurse visit to get done.    Delsa Grana, PA-C 10/27/18 1:06 PM  [facility]

## 2018-10-23 NOTE — Assessment & Plan Note (Signed)
Concern for metabolic syndrome and comorbidities - HTN, HLD, sleep apnea?  With strong cardiac family hx.  Needs increased physical activity and calorie reduction

## 2018-10-27 ENCOUNTER — Encounter: Payer: Self-pay | Admitting: Family Medicine

## 2019-02-23 ENCOUNTER — Other Ambulatory Visit: Payer: BC Managed Care – PPO

## 2019-02-23 ENCOUNTER — Other Ambulatory Visit: Payer: Self-pay

## 2019-02-23 DIAGNOSIS — E785 Hyperlipidemia, unspecified: Secondary | ICD-10-CM

## 2019-02-24 LAB — COMPLETE METABOLIC PANEL WITH GFR
AG Ratio: 1.9 (calc) (ref 1.0–2.5)
ALT: 69 U/L — ABNORMAL HIGH (ref 9–46)
AST: 41 U/L — ABNORMAL HIGH (ref 10–40)
Albumin: 4.3 g/dL (ref 3.6–5.1)
Alkaline phosphatase (APISO): 54 U/L (ref 36–130)
BUN: 20 mg/dL (ref 7–25)
CO2: 25 mmol/L (ref 20–32)
Calcium: 9.6 mg/dL (ref 8.6–10.3)
Chloride: 106 mmol/L (ref 98–110)
Creat: 0.94 mg/dL (ref 0.60–1.35)
GFR, Est African American: 117 mL/min/{1.73_m2} (ref >=60)
GFR, Est Non African American: 101 mL/min/{1.73_m2} (ref >=60)
Globulin: 2.3 g/dL (calc) (ref 1.9–3.7)
Glucose, Bld: 91 mg/dL (ref 65–99)
Potassium: 4.7 mmol/L (ref 3.5–5.3)
Sodium: 140 mmol/L (ref 135–146)
Total Bilirubin: 1.1 mg/dL (ref 0.2–1.2)
Total Protein: 6.6 g/dL (ref 6.1–8.1)

## 2019-02-24 LAB — LIPID PANEL
Cholesterol: 194 mg/dL (ref <200)
HDL: 44 mg/dL (ref >=40)
LDL Cholesterol (Calc): 133 mg/dL (calc) — ABNORMAL HIGH
Non-HDL Cholesterol (Calc): 150 mg/dL (calc) — ABNORMAL HIGH (ref <130)
Total CHOL/HDL Ratio: 4.4 (calc) (ref <5.0)
Triglycerides: 75 mg/dL (ref <150)

## 2019-12-17 ENCOUNTER — Emergency Department (HOSPITAL_COMMUNITY): Payer: BC Managed Care – PPO

## 2019-12-17 ENCOUNTER — Other Ambulatory Visit: Payer: Self-pay

## 2019-12-17 ENCOUNTER — Encounter (HOSPITAL_COMMUNITY): Payer: Self-pay | Admitting: Emergency Medicine

## 2019-12-17 ENCOUNTER — Telehealth: Payer: Self-pay

## 2019-12-17 ENCOUNTER — Emergency Department (HOSPITAL_COMMUNITY)
Admission: EM | Admit: 2019-12-17 | Discharge: 2019-12-17 | Disposition: A | Discharge status: Home / Self Care | Payer: BC Managed Care – PPO | Attending: Emergency Medicine | Admitting: Emergency Medicine

## 2019-12-17 ENCOUNTER — Ambulatory Visit: Payer: BC Managed Care – PPO | Admitting: Family Medicine

## 2019-12-17 VITALS — BP 90/50 | HR 92 | Temp 98.7°F | Ht 76.0 in | Wt 271.0 lb

## 2019-12-17 DIAGNOSIS — J1282 Pneumonia due to coronavirus disease 2019: Secondary | ICD-10-CM | POA: Diagnosis not present

## 2019-12-17 DIAGNOSIS — Z20822 Contact with and (suspected) exposure to covid-19: Secondary | ICD-10-CM

## 2019-12-17 DIAGNOSIS — I959 Hypotension, unspecified: Secondary | ICD-10-CM | POA: Diagnosis not present

## 2019-12-17 DIAGNOSIS — R509 Fever, unspecified: Secondary | ICD-10-CM | POA: Diagnosis not present

## 2019-12-17 DIAGNOSIS — Z79899 Other long term (current) drug therapy: Secondary | ICD-10-CM | POA: Diagnosis not present

## 2019-12-17 DIAGNOSIS — F17200 Nicotine dependence, unspecified, uncomplicated: Secondary | ICD-10-CM | POA: Insufficient documentation

## 2019-12-17 DIAGNOSIS — U071 COVID-19: Secondary | ICD-10-CM | POA: Diagnosis not present

## 2019-12-17 DIAGNOSIS — M791 Myalgia, unspecified site: Secondary | ICD-10-CM | POA: Diagnosis present

## 2019-12-17 LAB — COMPREHENSIVE METABOLIC PANEL
ALT: 25 U/L (ref 0–44)
AST: 27 U/L (ref 15–41)
Albumin: 3.5 g/dL (ref 3.5–5.0)
Alkaline Phosphatase: 44 U/L (ref 38–126)
Anion gap: 11 (ref 5–15)
BUN: 15 mg/dL (ref 6–20)
CO2: 21 mmol/L — ABNORMAL LOW (ref 22–32)
Calcium: 8.4 mg/dL — ABNORMAL LOW (ref 8.9–10.3)
Chloride: 102 mmol/L (ref 98–111)
Creatinine, Ser: 0.96 mg/dL (ref 0.61–1.24)
GFR calc Af Amer: >60 mL/min (ref >60)
GFR calc non Af Amer: >60 mL/min (ref >60)
Glucose, Bld: 106 mg/dL — ABNORMAL HIGH (ref 70–99)
Potassium: 3.9 mmol/L (ref 3.5–5.1)
Sodium: 134 mmol/L — ABNORMAL LOW (ref 135–145)
Total Bilirubin: 0.9 mg/dL (ref 0.3–1.2)
Total Protein: 6.6 g/dL (ref 6.5–8.1)

## 2019-12-17 LAB — CBC WITH DIFFERENTIAL/PLATELET
Abs Immature Granulocytes: 0.02 10*3/uL (ref 0.00–0.07)
Basophils Absolute: 0 10*3/uL (ref 0.0–0.1)
Basophils Relative: 0 %
Eosinophils Absolute: 0 10*3/uL (ref 0.0–0.5)
Eosinophils Relative: 0 %
HCT: 48.2 % (ref 39.0–52.0)
Hemoglobin: 15.4 g/dL (ref 13.0–17.0)
Immature Granulocytes: 0 %
Lymphocytes Relative: 14 %
Lymphs Abs: 0.6 10*3/uL — ABNORMAL LOW (ref 0.7–4.0)
MCH: 29.6 pg (ref 26.0–34.0)
MCHC: 32 g/dL (ref 30.0–36.0)
MCV: 92.5 fL (ref 80.0–100.0)
Monocytes Absolute: 0.2 10*3/uL (ref 0.1–1.0)
Monocytes Relative: 4 %
Neutro Abs: 3.7 10*3/uL (ref 1.7–7.7)
Neutrophils Relative %: 82 %
Platelets: 136 10*3/uL — ABNORMAL LOW (ref 150–400)
RBC: 5.21 MIL/uL (ref 4.22–5.81)
RDW: 13.3 % (ref 11.5–15.5)
WBC: 4.6 10*3/uL (ref 4.0–10.5)
nRBC: 0 % (ref 0.0–0.2)

## 2019-12-17 LAB — URINALYSIS, ROUTINE W REFLEX MICROSCOPIC
Bacteria, UA: NONE SEEN
Bilirubin Urine: NEGATIVE
Glucose, UA: NEGATIVE mg/dL
Hgb urine dipstick: NEGATIVE
Ketones, ur: 5 mg/dL — AB
Leukocytes,Ua: NEGATIVE
Nitrite: NEGATIVE
Protein, ur: 30 mg/dL — AB
Specific Gravity, Urine: 1.031 — ABNORMAL HIGH (ref 1.005–1.030)
pH: 5 (ref 5.0–8.0)

## 2019-12-17 LAB — LACTIC ACID, PLASMA: Lactic Acid, Venous: 1.4 mmol/L (ref 0.5–1.9)

## 2019-12-17 LAB — SARS CORONAVIRUS 2 BY RT PCR (HOSPITAL ORDER, PERFORMED IN ~~LOC~~ HOSPITAL LAB): SARS Coronavirus 2: POSITIVE — AB

## 2019-12-17 MED ORDER — DEXAMETHASONE SODIUM PHOSPHATE 10 MG/ML IJ SOLN
10.0000 mg | Freq: Once | INTRAMUSCULAR | Status: AC
  Administered 2019-12-17: 10 mg via INTRAVENOUS
  Filled 2019-12-17: qty 1

## 2019-12-17 MED ORDER — SODIUM CHLORIDE 0.9% FLUSH
3.0000 mL | Freq: Once | INTRAVENOUS | Status: AC
  Administered 2019-12-17: 3 mL via INTRAVENOUS

## 2019-12-17 MED ORDER — DOXYCYCLINE HYCLATE 100 MG PO CAPS: 100.0000 mg | ORAL_CAPSULE | Freq: Two times a day (BID) | ORAL | 0 refills | Status: AC

## 2019-12-17 MED ORDER — KETOROLAC TROMETHAMINE 15 MG/ML IJ SOLN
15.0000 mg | Freq: Once | INTRAMUSCULAR | Status: AC
  Administered 2019-12-17: 15 mg via INTRAVENOUS
  Filled 2019-12-17: qty 1

## 2019-12-17 MED ORDER — LACTATED RINGERS IV BOLUS
1000.0000 mL | Freq: Once | INTRAVENOUS | Status: AC
  Administered 2019-12-17: 1000 mL via INTRAVENOUS

## 2019-12-17 MED ORDER — ACETAMINOPHEN 500 MG PO TABS
1000.0000 mg | ORAL_TABLET | Freq: Once | ORAL | Status: AC
  Administered 2019-12-17: 1000 mg via ORAL
  Filled 2019-12-17: qty 2

## 2019-12-17 NOTE — ED Provider Notes (Signed)
[facility] EMERGENCY DEPARTMENT Provider Note   CSN: [id number] Arrival date & time: 12/17/19  1317     History Chief Complaint  Patient presents with  . Hypotension  . Generalized Body Aches    Sean Oconnor is a 44 y.o. male.  HPI   Patient presents to the ED for evaluation of multiple complaints.  The patient notes he was referred to the ED by his PCP earlier today for COVID-19 testing.  The patient has had generalized arthralgias and myalgias for several weeks and overall feeling poorly.  Patient had direct contact with positive COVID-19 individual 2 weeks ago who lives in his home but subsequently had interim negative test.  He denies any chest pain.  Denies nausea, vomiting or diarrhea.  He reports his blood pressures have been low at home in the 90s.  He endorses fevers and chills as well.  T-max 102.  No previous cardiac history.  No significant medical history aside from hypercholesterolemia.     Past Medical History:  Diagnosis Date  . Hyperlipidemia     Patient Active Problem List   Diagnosis Date Noted  . Hyperlipidemia 10/23/2018  . Family history of MI (myocardial infarction) 10/23/2018  . Family history of hyperlipidemia 10/23/2018  . Elevated blood pressure reading 10/23/2018  . Class 3 severe obesity with serious comorbidity and body mass index (BMI) of 40.0 to 44.9 in adult (HCC) 10/23/2018  . Routine general medical examination at a health care facility 10/22/2018  . Screening cholesterol level 10/22/2018    History reviewed. No pertinent surgical history.     Family History  Problem Relation Age of Onset  . Hyperlipidemia Father   . Heart disease Father   . Heart disease Brother   . Hyperlipidemia Brother   . Hyperlipidemia Brother   . Heart disease Brother     Social History   Tobacco Use  . Smoking status: Former Games developer  . Smokeless tobacco: Never Used  Vaping Use  . Vaping Use: Never used  Substance Use Topics    . Alcohol use: Yes  . Drug use: No    Home Medications Prior to Admission medications   Medication Sig Start Date End Date Taking? Authorizing Provider  atorvastatin (LIPITOR) 40 MG tablet Take 1 tablet (40 mg total) by mouth at bedtime. Patient not taking: Reported on 12/17/2019 10/23/18   Danelle Berry, PA-C  doxycycline (VIBRAMYCIN) 100 MG capsule Take 1 capsule (100 mg total) by mouth 2 (two) times daily. 12/17/19   Nino Parsley, MD  lisinopril (ZESTRIL) 20 MG tablet Take 1 tablet (20 mg total) by mouth daily. Patient not taking: Reported on 12/17/2019 10/23/18   Danelle Berry, PA-C    Allergies    Patient has no known allergies.  Review of Systems   Review of Systems  Constitutional: Positive for chills, fatigue and fever.  HENT: Negative for ear pain and sore throat.   Eyes: Negative for pain and visual disturbance.  Respiratory: Negative for cough and shortness of breath.   Cardiovascular: Negative for chest pain and palpitations.  Gastrointestinal: Negative for abdominal pain and vomiting.  Genitourinary: Negative for dysuria and hematuria.  Musculoskeletal: Positive for arthralgias and myalgias. Negative for back pain.  Skin: Negative for color change and rash.  Neurological: Negative for seizures and syncope.  All other systems reviewed and are negative.   Physical Exam Updated Vital Signs BP 120/69   Pulse 87   Temp 98.4 F (36.9 C)   Resp (!)  27   Ht 6\' 4"  (1.93 m)   Wt 122.5 kg   SpO2 94%   BMI 32.87 kg/m   Physical Exam Vitals and nursing note reviewed.  Constitutional:      General: He is not in acute distress.    Appearance: Normal appearance. He is well-developed and normal weight. He is not ill-appearing or toxic-appearing.  HENT:     Head: Normocephalic and atraumatic.     Right Ear: Tympanic membrane normal.     Left Ear: Tympanic membrane normal.  Eyes:     Extraocular Movements: Extraocular movements intact.     Conjunctiva/sclera: Conjunctivae  normal.     Pupils: Pupils are equal, round, and reactive to light.  Cardiovascular:     Rate and Rhythm: Normal rate and regular rhythm.     Heart sounds: No murmur heard.   Pulmonary:     Effort: Pulmonary effort is normal. No respiratory distress.     Breath sounds: Normal breath sounds.  Abdominal:     General: There is no distension.     Palpations: Abdomen is soft. There is no mass.     Tenderness: There is no abdominal tenderness.  Musculoskeletal:     Cervical back: Neck supple. No rigidity.  Skin:    General: Skin is warm and dry.  Neurological:     General: No focal deficit present.     Mental Status: He is alert and oriented to person, place, and time. Mental status is at baseline.  Psychiatric:        Mood and Affect: Mood normal.        Behavior: Behavior normal.     ED Results / Procedures / Treatments   Labs (all labs ordered are listed, but only abnormal results are displayed) Labs Reviewed  SARS CORONAVIRUS 2 BY RT PCR (HOSPITAL ORDER, PERFORMED IN [facility]) - Abnormal; Notable for the following components:      Result Value   SARS Coronavirus 2 POSITIVE (*)    All other components within normal limits  COMPREHENSIVE METABOLIC PANEL - Abnormal; Notable for the following components:   Sodium 134 (*)    CO2 21 (*)    Glucose, Bld 106 (*)    Calcium 8.4 (*)    All other components within normal limits  CBC WITH DIFFERENTIAL/PLATELET - Abnormal; Notable for the following components:   Platelets 136 (*)    Lymphs Abs 0.6 (*)    All other components within normal limits  URINALYSIS, ROUTINE W REFLEX MICROSCOPIC - Abnormal; Notable for the following components:   Color, Urine AMBER (*)    Specific Gravity, Urine 1.031 (*)    Ketones, ur 5 (*)    Protein, ur 30 (*)    Non Squamous Epithelial 0-5 (*)    All other components within normal limits  LACTIC ACID, PLASMA  PATHOLOGIST SMEAR REVIEW    EKG None  Radiology DG Chest Portable 1  View  Result Date: 12/17/2019 CLINICAL DATA:  Hypotensive. Body aches. Low blood pressure. COVID exposure. EXAM: PORTABLE CHEST 1 VIEW COMPARISON:  03/31/2012 FINDINGS: Shallow inspiration. Heart size and pulmonary vascularity are normal for technique. Streaky perihilar opacities with peribronchial thickening and suggestion of infiltration in the left lung base possibly representing early pneumonia. No pleural effusions. Mediastinal contours appear intact. IMPRESSION: Shallow inspiration. Perihilar opacities and peribronchial thickening with suggestion of infiltration in the left lung base possibly representing early pneumonia. Electronically Signed   By: 04/02/2012.D.  On: 12/17/2019 21:14    Procedures Procedures (including critical care time)  Medications Ordered in ED Medications  sodium chloride flush (NS) 0.9 % injection 3 mL (3 mLs Intravenous Given 12/17/19 2142)  lactated ringers bolus 1,000 mL (0 mLs Intravenous Stopped 12/17/19 2220)  dexamethasone (DECADRON) injection 10 mg (10 mg Intravenous Given 12/17/19 2142)  ketorolac (TORADOL) 15 MG/ML injection 15 mg (15 mg Intravenous Given 12/17/19 2225)  acetaminophen (TYLENOL) tablet 1,000 mg (1,000 mg Oral Given 12/17/19 2225)    ED Course  Sean Oconnor is a 44 y.o. male with PMHx listed that presents to the Emergency Department complaint of Hypotension and Generalized Body Aches   ED Course: Initial exam completed.   Well-appearing and hemodynamically stable.  Nontoxic and afebrile.  Physical exam significant for age-appropriate 44 year old male with extremities well perfused, clear breath sounds bilaterally, abdomen soft, nondistended, nontender, and strong peripheral pulses.  Initial differential includes electrolyte abnormalities, viral illness, dehydration, pulmonary embolism, and pneumonia.   Triage work-up reviewed.  EKG sinus rhythm with normal rate and interval without evidence of acute ST changes.  Initial hypotension  resolved without intervention.  1 L normal saline bolus for fluid resuscitation.  Lactic acid negative.  CMP with no acute electrolyte abnormalities requiring urgent intervention.  CBC without evidence of leukocytosis or leukopenia and stable hemoglobin although there is some thrombocytopenia with platelets 136.  CXR does show perihilar opacities and peribronchial thickening suggestive of infiltration possibly representing early pneumonia.  COVID-19 positive. UA mild signs of dehydration.   Patient's presentation today is consistent with viral pneumonia and associated symptomatology.  Decadron x1 given in the emergency department.  Toradol/Tylenol for symptom control.  No evidence of hypoxia.  Additionally, patient be given prescription for coverage of potential secondary pneumonia with doxycycline.  Referral made to monoclonal antibody clinic.  Diagnostics Vital Signs: reviewed Labs: reviewed and significant findings discussed above Imaging: personally reviewed images interpreted by radiology EKG: reviewed Records: nursing notes along with previous records reviewed and pertinent data discussed   Consults:  None   Reevaluation/Disposition:  Upon reevaluation, patients symptoms stable. No active nausea/vomiting and ambulatory without assistance prior to discharge from the emergency department.    All questions answered.  Strict return precautions were discussed. Additionally we discussed establishing and/or following-up with primary care physician.  Patient and/or family was understanding and in agreement with today's assessment and plan.   Campbell Riches, MD Emergency Medicine, PGY-3   Note: Dragon medical dictation software was used in the creation of this note.   Final Clinical Impression(s) / ED Diagnoses Final diagnoses:  Pneumonia due to COVID-19 virus    Rx / DC Orders ED Discharge Orders         Ordered    doxycycline (VIBRAMYCIN) 100 MG capsule  2 times daily     Discontinue   Reprint     12/17/19 2233           Nino Parsley, MD 12/17/19 2359    Jacalyn Lefevre, MD 12/22/19 [phone]

## 2019-12-17 NOTE — Telephone Encounter (Signed)
Spoke with the triage nurse at [facility] to inform her pt is on his way in his car with possible covid. Symptoms include sob, fever, 90/50 bp 94% O2 stats.

## 2019-12-17 NOTE — Discharge Instructions (Addendum)
Please remain home and isolate until cleared following CDC recommendations. Drink plenty of fluids.  Tylenol/motrin as needed for pain control.  Referral was placed to antibody clinic who should reach out to you.  Return to ED for any worsening or concerning symptoms.

## 2019-12-17 NOTE — Progress Notes (Signed)
Subjective:    Patient ID: Sean Oconnor, male    DOB: 08-26-1975, 44 y.o.   MRN: [id number]  HPI Patient is a 44 year old Caucasian male who presents today complaining of feeling sick.  His wife tested positive for Covid Monday.  Her symptoms of past.  He tested negative when she tested positive however he had not yet started having symptoms.  He now reports diffuse body aches.  He reports myalgias and arthralgias.  He has an occasional cough.  He have some rapidly fluctuating temperatures.  He went to an urgent care and received 2 L of IV fluids on Wednesday.  He is here today.  The patient is relatively hypotensive at 90/50.  This is a patient with a previous history of hypertension.  He reports feeling lightheaded and dizzy.  He denies any shortness of breath.  He denies any chest pain however he reports severe arthralgias and myalgias.  He also has been bitten by numerous ticks recently.  He denies any rash associated with that.  On examination today, the patient has crackles anteriorly in his left chest and some fine crackles posteriorly at the left base.  Patient's oxygen saturation is 94% on room air Past Medical History:  Diagnosis Date  . Hyperlipidemia    No past surgical history on file.  Current Outpatient Medications on File Prior to Visit  Medication Sig Dispense Refill  . atorvastatin (LIPITOR) 40 MG tablet Take 1 tablet (40 mg total) by mouth at bedtime. (Patient not taking: Reported on 12/17/2019) 90 tablet 2  . lisinopril (ZESTRIL) 20 MG tablet Take 1 tablet (20 mg total) by mouth daily. (Patient not taking: Reported on 12/17/2019) 90 tablet 3   No current facility-administered medications on file prior to visit.   No Known Allergies Social History   Socioeconomic History  . Marital status: Married    Spouse name: Not on file  . Number of children: 3  . Years of education: Not on file  . Highest education level: Not on file  Occupational History  . Not on file    Tobacco Use  . Smoking status: Former Games developer  . Smokeless tobacco: Never Used  Vaping Use  . Vaping Use: Never used  Substance and Sexual Activity  . Alcohol use: Yes  . Drug use: No  . Sexual activity: Yes  Other Topics Concern  . Not on file  Social History Narrative  . Not on file   Social Determinants of Health   Financial Resource Strain:   . Difficulty of Paying Living Expenses:   Food Insecurity:   . Worried About Programme researcher, broadcasting/film/video in the Last Year:   . Barista in the Last Year:   Transportation Needs:   . Freight forwarder (Medical):   Marland Kitchen Lack of Transportation (Non-Medical):   Physical Activity:   . Days of Exercise per Week:   . Minutes of Exercise per Session:   Stress:   . Feeling of Stress :   Social Connections:   . Frequency of Communication with Friends and Family:   . Frequency of Social Gatherings with Friends and Family:   . Attends Religious Services:   . Active Member of Clubs or Organizations:   . Attends Banker Meetings:   Marland Kitchen Marital Status:   Intimate Partner Violence:   . Fear of Current or Ex-Partner:   . Emotionally Abused:   Marland Kitchen Physically Abused:   . Sexually Abused:  Review of Systems  All other systems reviewed and are negative.      Objective:   Physical Exam Constitutional:      Appearance: He is ill-appearing and diaphoretic.  Eyes:     Conjunctiva/sclera: Conjunctivae normal.  Cardiovascular:     Rate and Rhythm: Tachycardia present.     Heart sounds: Normal heart sounds.  Pulmonary:     Effort: Pulmonary effort is normal.     Breath sounds: Rales present.  Musculoskeletal:     Right lower leg: No edema.     Left lower leg: No edema.  Neurological:     Mental Status: He is alert.           Assessment & Plan:  Acute hypotension  Fever and chills  Close exposure to COVID-19 virus  Patient has not been vaccinated.  He had close exposure to COVID-19 from his wife.  He is now  having hypotension, fever chills, arthralgias, myalgias, mildly suppressed pulse oximetry.  Patient appears ill and is hypotensive.  I believe the patient requires IV fluids.  He needs rapid Covid testing which I do not have here in my office.  He also needs a chest x-ray to determine if he may have community-acquired pneumonia versus COVID-19.  I believe the patient would benefit from IV fluids and likely remdesivir if he does have Covid.  Given his declining pulse oximetry and worsening clinical appearance (he is hypotensive and has already failed outpatient IV fluid on Wednesday) he may also benefit from Regeneron.  Patient was sent directly to the emergency room for further evaluation as he does not appear stable to stay at home due to hypotension/shock

## 2019-12-17 NOTE — ED Notes (Signed)
Informed triage nurse about pt feeling weaker

## 2019-12-17 NOTE — ED Triage Notes (Signed)
Pt sent from pcp with concerns of covid, reports bp has been low in the 90s and pt c/o body aches, had covid test last week that was negative, wife recently had covid. Denies cough, endorses fever.

## 2019-12-18 ENCOUNTER — Telehealth (HOSPITAL_COMMUNITY): Payer: Self-pay | Admitting: Nurse Practitioner

## 2019-12-18 ENCOUNTER — Encounter: Payer: Self-pay | Admitting: Nurse Practitioner

## 2019-12-18 DIAGNOSIS — U071 COVID-19: Secondary | ICD-10-CM | POA: Insufficient documentation

## 2019-12-18 NOTE — Telephone Encounter (Signed)
Called to discuss with Charlotta Newton about Covid symptoms and the use of regeneron, a monoclonal antibody infusion for those with mild to moderate Covid symptoms and at a high risk of hospitalization.     Pt is qualified for this infusion at the [facility] infusion center due to co-morbid conditions and/or a member of an at-risk group, however declines infusion at this time as he says his symptoms are improving. Symptoms tier reviewed as well as criteria for ending isolation.  Symptoms reviewed that would warrant ED/Hospital evaluation. Preventative practices reviewed. Patient verbalized understanding. Patient advised to call back if he/she opts to proceed with infusion. Callback number provided. Urgent care and/or ER precautions given for severe symptoms. Last date eligible for infusion:   12/20/2019.  Patient Active Problem List   Diagnosis Date Noted  . Hyperlipidemia 10/23/2018  . Family history of MI (myocardial infarction) 10/23/2018  . Family history of hyperlipidemia 10/23/2018  . Elevated blood pressure reading 10/23/2018  . Class 3 severe obesity with serious comorbidity and body mass index (BMI) of 40.0 to 44.9 in adult (HCC) 10/23/2018  . Routine general medical examination at a health care facility 10/22/2018  . Screening cholesterol level 10/22/2018     Consuello Masse, NP [facility]  [email or web address]

## 2019-12-21 LAB — PATHOLOGIST SMEAR REVIEW

## 2019-12-23 ENCOUNTER — Other Ambulatory Visit: Payer: Self-pay

## 2019-12-23 ENCOUNTER — Telehealth: Payer: BC Managed Care – PPO | Admitting: Family Medicine

## 2020-01-07 ENCOUNTER — Telehealth: Payer: Self-pay | Admitting: Family Medicine

## 2020-01-07 NOTE — Telephone Encounter (Signed)
Cb [phone]  for Fml[employer] forms ready

## 2020-02-14 NOTE — Telephone Encounter (Signed)
FMLA noted completed and scanned into patient chart.

## 2021-01-01 IMAGING — DX DG CHEST 1V PORT
1 series · 1 of 1 positions shown · non-contrast
Comparison: 03/31/2012

CLINICAL DATA: Hypotensive. Body aches. Low blood pressure. COVID
exposure.

EXAM:
PORTABLE CHEST 1 VIEW

[chest ap]
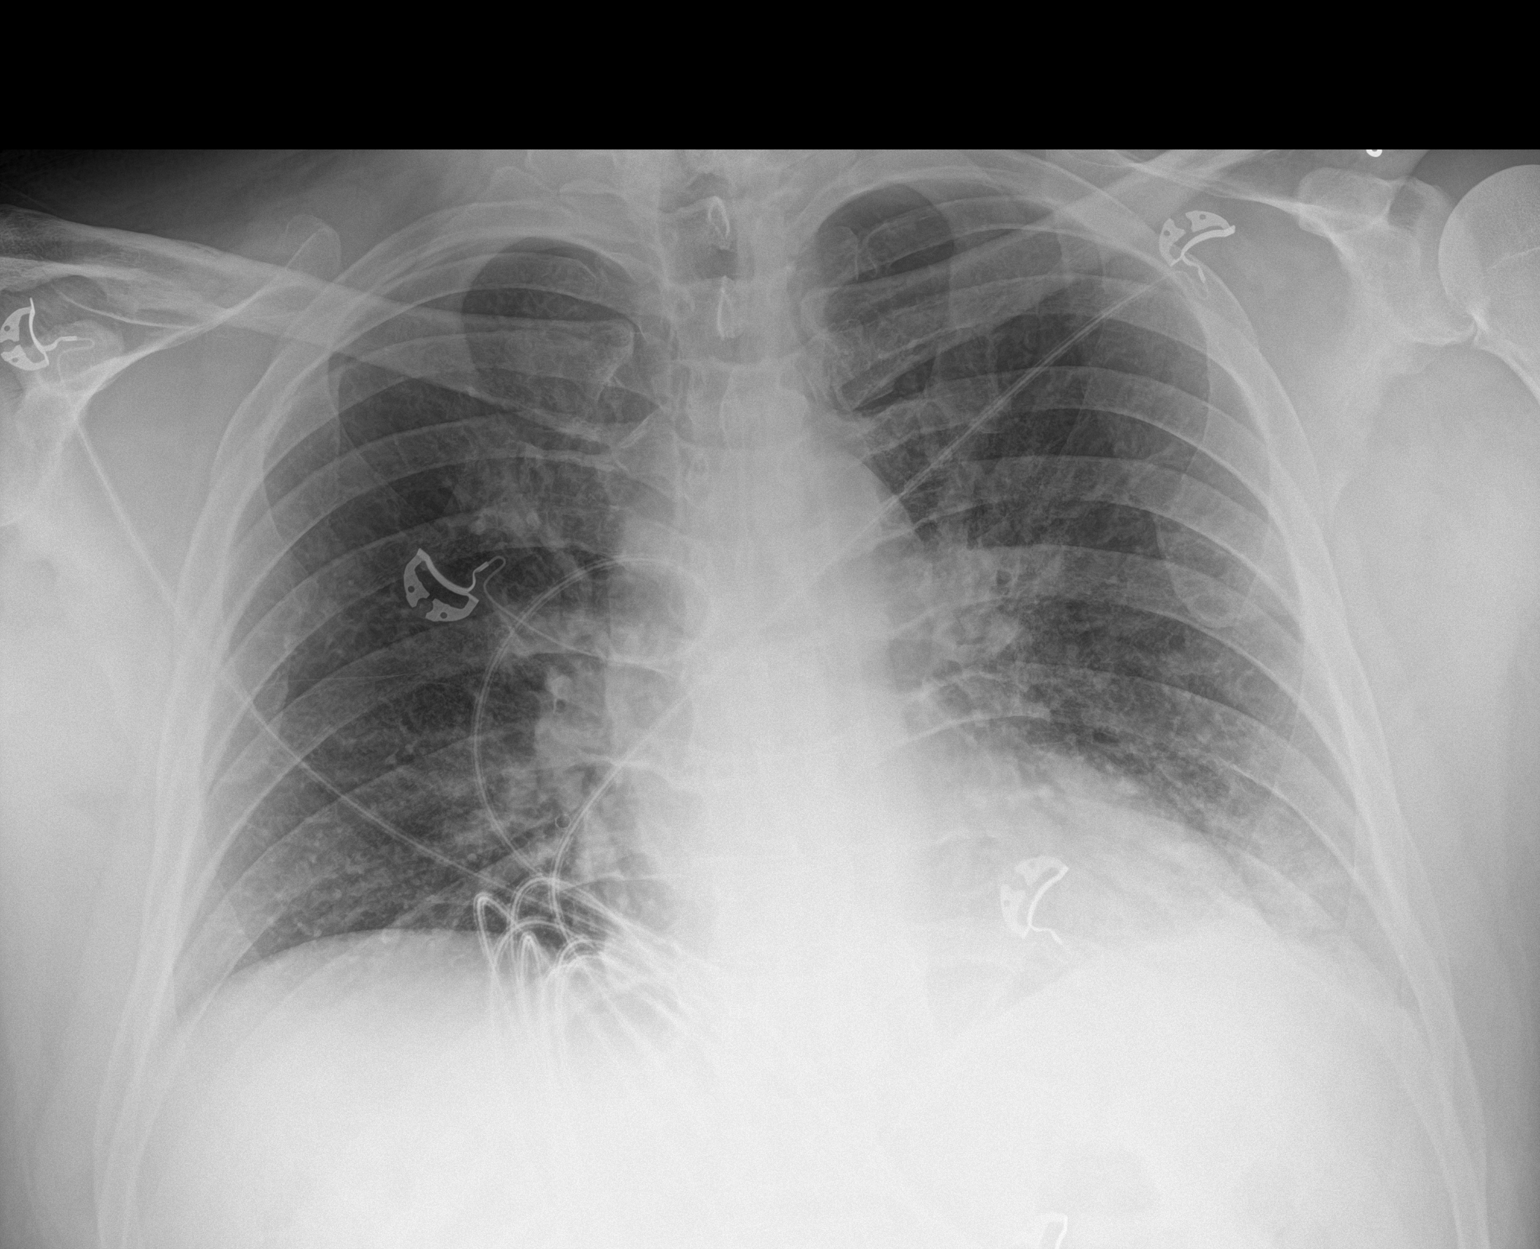

[1 of 1 positions shown; findings below may reference images not displayed]

FINDINGS: Shallow inspiration. Heart size and pulmonary vascularity are normal
for technique. Streaky perihilar opacities with peribronchial
thickening and suggestion of infiltration in the left lung base
possibly representing early pneumonia. No pleural effusions.
Mediastinal contours appear intact.
IMPRESSION: Shallow inspiration. Perihilar opacities and peribronchial
thickening with suggestion of infiltration in the left lung base
possibly representing early pneumonia.

## 2022-12-06 ENCOUNTER — Ambulatory Visit: Payer: Self-pay | Admitting: Cardiology
